# Patient Record
Sex: Female | Born: 1975 | Race: Black or African American | Hispanic: No | Marital: Married | Smoking: Current every day smoker
Health system: Southern US, Community
[De-identification: ages and names within clinical notes are randomized; demographics above are authoritative.]

## PROBLEM LIST (undated history)

## (undated) DIAGNOSIS — F329 Major depressive disorder, single episode, unspecified: Secondary | ICD-10-CM

## (undated) DIAGNOSIS — F1910 Other psychoactive substance abuse, uncomplicated: Secondary | ICD-10-CM

## (undated) DIAGNOSIS — M549 Dorsalgia, unspecified: Secondary | ICD-10-CM

## (undated) DIAGNOSIS — F32A Depression, unspecified: Secondary | ICD-10-CM

## (undated) HISTORY — DX: Major depressive disorder, single episode, unspecified: F32.9

## (undated) HISTORY — DX: Depression, unspecified: F32.A

## (undated) HISTORY — DX: Other psychoactive substance abuse, uncomplicated: F19.10

## (undated) HISTORY — PX: DEBRIDEMENT AND CLOSURE WOUND: SHX5614

## (undated) HISTORY — PX: ABDOMINAL HYSTERECTOMY: SHX81

---

## 2001-01-14 ENCOUNTER — Emergency Department (HOSPITAL_COMMUNITY): Admission: EM | Admit: 2001-01-14 | Discharge: 2001-01-14 | Payer: Self-pay | Admitting: Internal Medicine

## 2004-10-15 ENCOUNTER — Emergency Department (HOSPITAL_COMMUNITY): Admission: EM | Admit: 2004-10-15 | Discharge: 2004-10-15 | Payer: Self-pay | Admitting: *Deleted

## 2005-02-27 ENCOUNTER — Emergency Department (HOSPITAL_COMMUNITY): Admission: EM | Admit: 2005-02-27 | Discharge: 2005-02-27 | Payer: Self-pay | Admitting: Emergency Medicine

## 2005-04-26 ENCOUNTER — Emergency Department (HOSPITAL_COMMUNITY): Admission: EM | Admit: 2005-04-26 | Discharge: 2005-04-26 | Payer: Self-pay | Admitting: Emergency Medicine

## 2005-06-12 ENCOUNTER — Emergency Department (HOSPITAL_COMMUNITY): Admission: EM | Admit: 2005-06-12 | Discharge: 2005-06-12 | Payer: Self-pay | Admitting: Emergency Medicine

## 2005-08-06 ENCOUNTER — Emergency Department (HOSPITAL_COMMUNITY): Admission: EM | Admit: 2005-08-06 | Discharge: 2005-08-06 | Payer: Self-pay | Admitting: Emergency Medicine

## 2005-11-30 ENCOUNTER — Emergency Department (HOSPITAL_COMMUNITY): Admission: EM | Admit: 2005-11-30 | Discharge: 2005-11-30 | Payer: Self-pay | Admitting: Emergency Medicine

## 2006-02-12 ENCOUNTER — Emergency Department (HOSPITAL_COMMUNITY): Admission: EM | Admit: 2006-02-12 | Discharge: 2006-02-12 | Payer: Self-pay | Admitting: Emergency Medicine

## 2006-07-30 ENCOUNTER — Emergency Department (HOSPITAL_COMMUNITY): Admission: EM | Admit: 2006-07-30 | Discharge: 2006-07-30 | Payer: Self-pay | Admitting: Emergency Medicine

## 2006-12-13 ENCOUNTER — Emergency Department (HOSPITAL_COMMUNITY): Admission: EM | Admit: 2006-12-13 | Discharge: 2006-12-13 | Payer: Self-pay | Admitting: Emergency Medicine

## 2007-01-10 ENCOUNTER — Emergency Department (HOSPITAL_COMMUNITY): Admission: EM | Admit: 2007-01-10 | Discharge: 2007-01-10 | Payer: Self-pay | Admitting: Emergency Medicine

## 2007-02-27 ENCOUNTER — Emergency Department (HOSPITAL_COMMUNITY): Admission: EM | Admit: 2007-02-27 | Discharge: 2007-02-27 | Payer: Self-pay | Admitting: Emergency Medicine

## 2007-03-05 ENCOUNTER — Emergency Department (HOSPITAL_COMMUNITY): Admission: EM | Admit: 2007-03-05 | Discharge: 2007-03-05 | Payer: Self-pay | Admitting: Emergency Medicine

## 2007-04-18 ENCOUNTER — Emergency Department (HOSPITAL_COMMUNITY): Admission: EM | Admit: 2007-04-18 | Discharge: 2007-04-18 | Payer: Self-pay | Admitting: Emergency Medicine

## 2007-06-20 ENCOUNTER — Emergency Department (HOSPITAL_COMMUNITY): Admission: EM | Admit: 2007-06-20 | Discharge: 2007-06-20 | Payer: Self-pay | Admitting: Emergency Medicine

## 2007-11-25 ENCOUNTER — Emergency Department (HOSPITAL_COMMUNITY): Admission: EM | Admit: 2007-11-25 | Discharge: 2007-11-25 | Payer: Self-pay | Admitting: Emergency Medicine

## 2007-12-25 ENCOUNTER — Emergency Department (HOSPITAL_COMMUNITY): Admission: EM | Admit: 2007-12-25 | Discharge: 2007-12-25 | Payer: Self-pay | Admitting: Emergency Medicine

## 2008-02-17 ENCOUNTER — Emergency Department (HOSPITAL_COMMUNITY): Admission: EM | Admit: 2008-02-17 | Discharge: 2008-02-17 | Payer: Self-pay | Admitting: Emergency Medicine

## 2008-03-19 ENCOUNTER — Emergency Department (HOSPITAL_COMMUNITY): Admission: EM | Admit: 2008-03-19 | Discharge: 2008-03-19 | Payer: Self-pay | Admitting: Emergency Medicine

## 2008-04-21 ENCOUNTER — Emergency Department (HOSPITAL_COMMUNITY): Admission: EM | Admit: 2008-04-21 | Discharge: 2008-04-21 | Payer: Self-pay | Admitting: Emergency Medicine

## 2008-05-30 ENCOUNTER — Emergency Department (HOSPITAL_COMMUNITY): Admission: EM | Admit: 2008-05-30 | Discharge: 2008-05-30 | Payer: Self-pay | Admitting: Emergency Medicine

## 2008-06-24 ENCOUNTER — Emergency Department (HOSPITAL_COMMUNITY): Admission: EM | Admit: 2008-06-24 | Discharge: 2008-06-24 | Payer: Self-pay | Admitting: Emergency Medicine

## 2008-12-24 ENCOUNTER — Emergency Department (HOSPITAL_COMMUNITY): Admission: EM | Admit: 2008-12-24 | Discharge: 2008-12-24 | Payer: Self-pay | Admitting: Emergency Medicine

## 2009-06-13 IMAGING — CR DG LUMBAR SPINE COMPLETE 4+V
5 series · 5 of 5 positions shown · non-contrast
Comparison: none

CLINICAL DATA: Fall, low back pain

LUMBAR SPINE - 4  VIEW:

[view not recorded (1 of 5)]
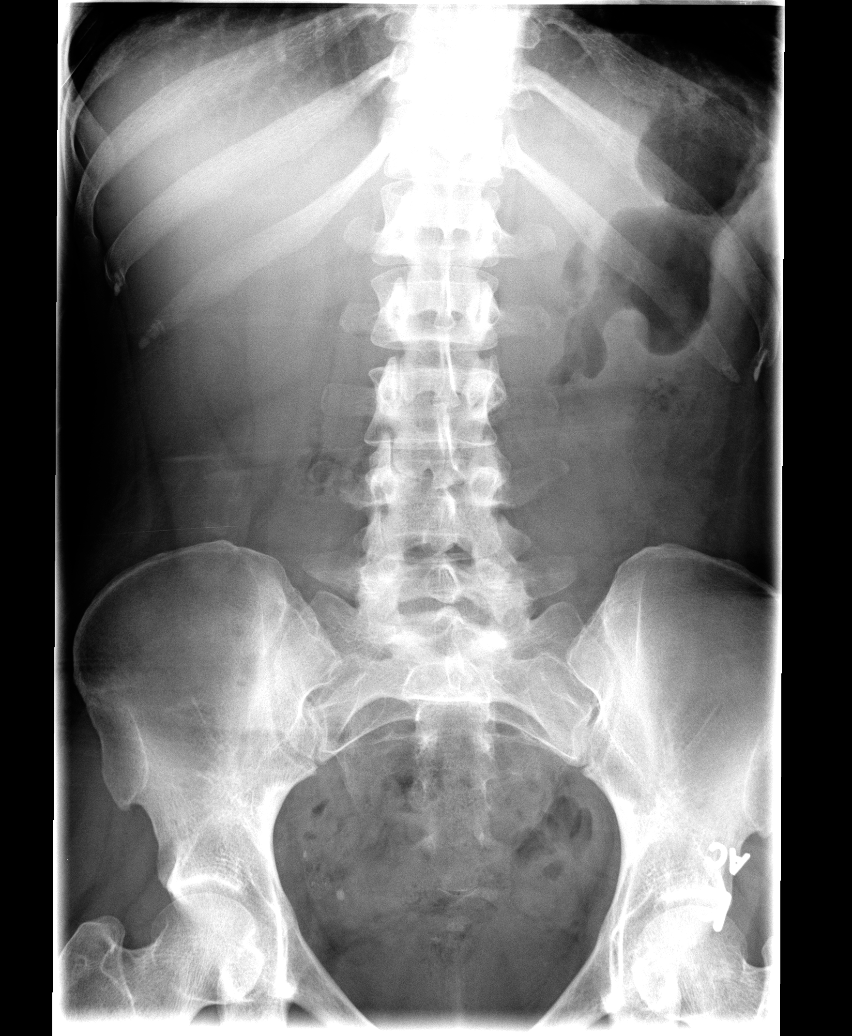

[view not recorded (2 of 5)]
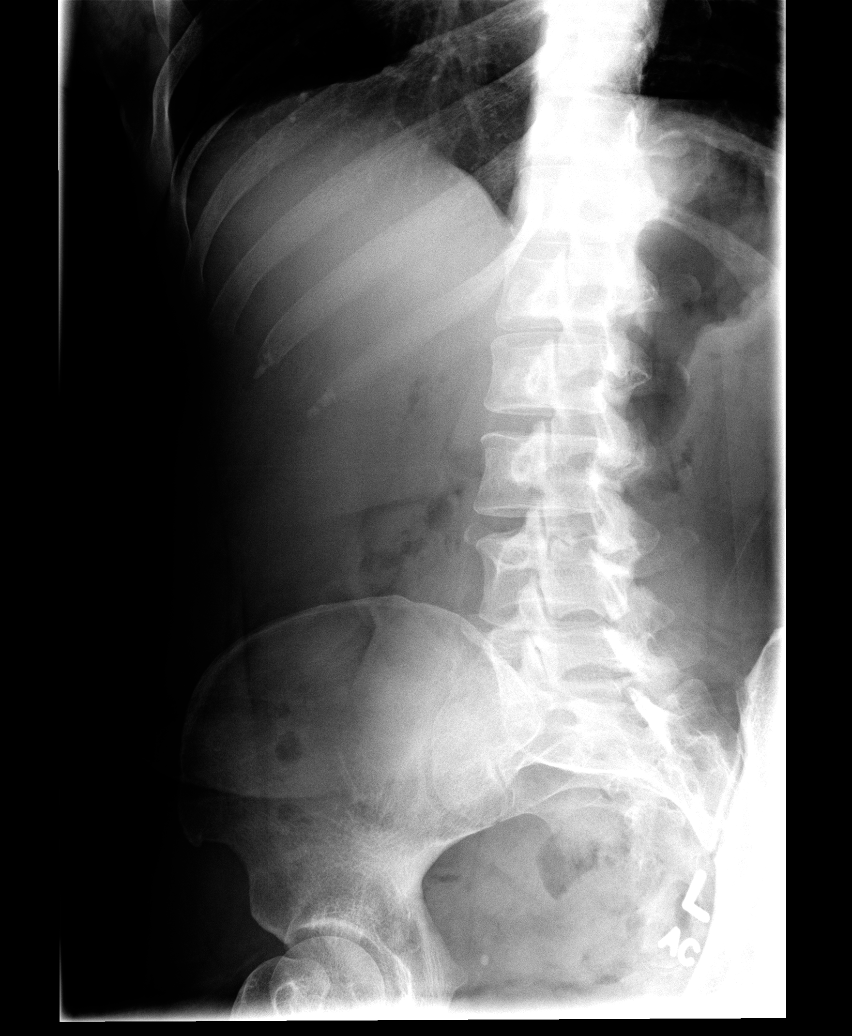

[view not recorded (3 of 5)]
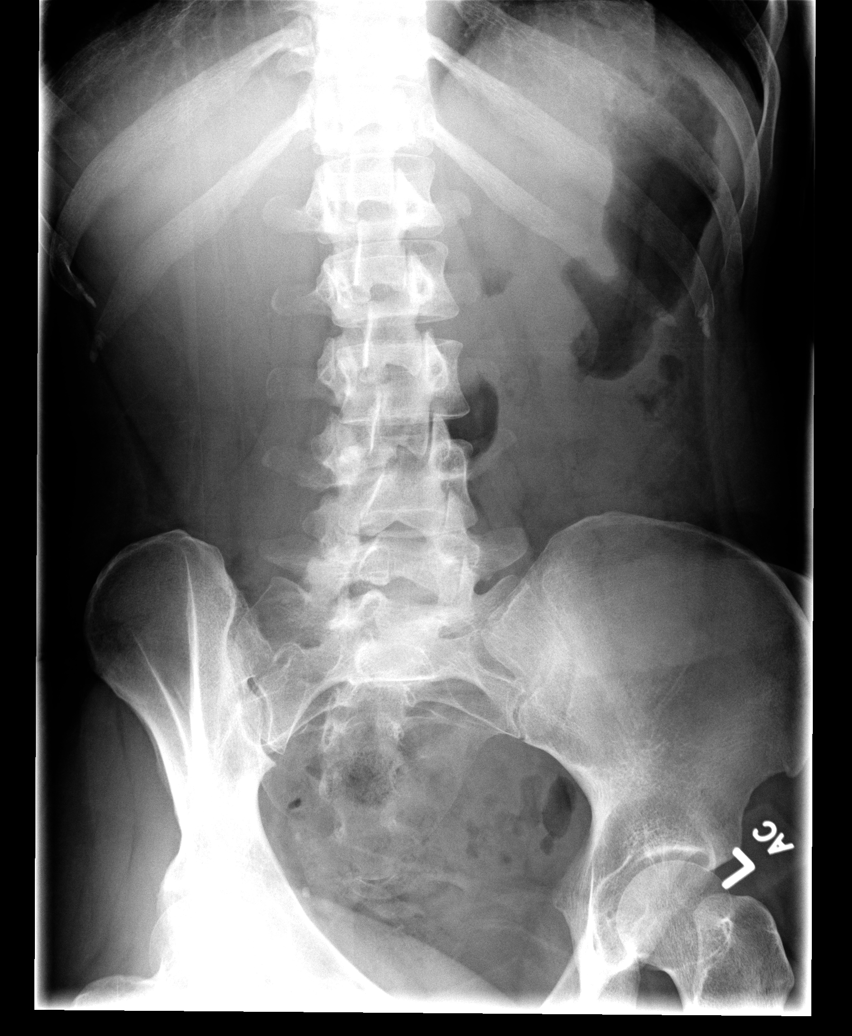

[view not recorded (4 of 5)]
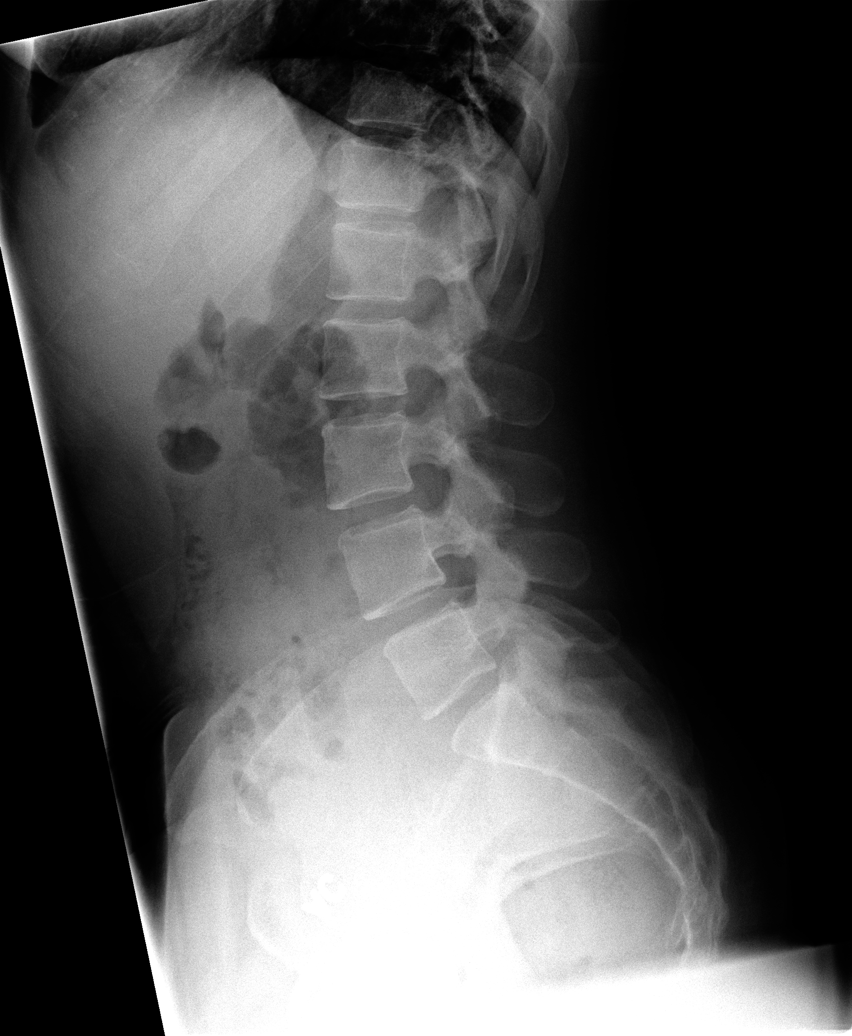

[view not recorded (5 of 5)]
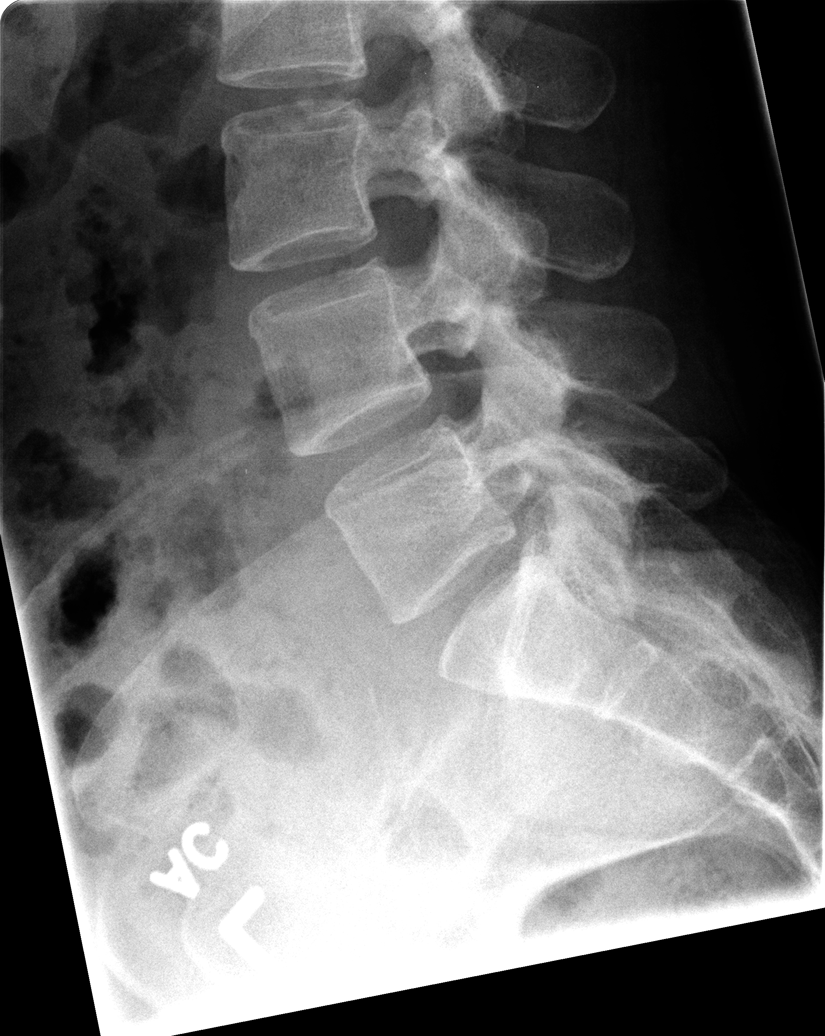

[5 of 5 positions shown; findings below may reference images not displayed]

FINDINGS: There is no evidence of lumbar spine fracture.  Alignment is normal. 
Intervertebral disc spaces are maintained, and no other significant bone
abnormalities are identified.
IMPRESSION: Negative lumbar spine radiographs.

## 2009-11-17 ENCOUNTER — Emergency Department (HOSPITAL_COMMUNITY): Admission: EM | Admit: 2009-11-17 | Discharge: 2009-11-17 | Payer: Self-pay | Admitting: Diagnostic Radiology

## 2010-09-25 ENCOUNTER — Emergency Department (HOSPITAL_COMMUNITY)
Admission: EM | Admit: 2010-09-25 | Discharge: 2010-09-25 | Payer: Self-pay | Source: Home / Self Care | Admitting: Emergency Medicine

## 2011-02-16 ENCOUNTER — Inpatient Hospital Stay (INDEPENDENT_AMBULATORY_CARE_PROVIDER_SITE_OTHER)
Admission: RE | Admit: 2011-02-16 | Discharge: 2011-02-16 | Disposition: A | Discharge status: Home / Self Care | Payer: Self-pay | Source: Ambulatory Visit | Attending: Family Medicine | Admitting: Family Medicine

## 2011-02-16 DIAGNOSIS — K089 Disorder of teeth and supporting structures, unspecified: Secondary | ICD-10-CM

## 2011-05-10 ENCOUNTER — Emergency Department (HOSPITAL_COMMUNITY)
Admission: EM | Admit: 2011-05-10 | Discharge: 2011-05-10 | Disposition: A | Discharge status: Home / Self Care | Payer: Self-pay | Attending: Emergency Medicine | Admitting: Emergency Medicine

## 2011-05-10 ENCOUNTER — Encounter: Payer: Self-pay | Admitting: *Deleted

## 2011-05-10 DIAGNOSIS — K047 Periapical abscess without sinus: Secondary | ICD-10-CM

## 2011-05-10 DIAGNOSIS — K044 Acute apical periodontitis of pulpal origin: Secondary | ICD-10-CM | POA: Insufficient documentation

## 2011-05-10 DIAGNOSIS — S335XXA Sprain of ligaments of lumbar spine, initial encounter: Secondary | ICD-10-CM | POA: Insufficient documentation

## 2011-05-10 DIAGNOSIS — X500XXA Overexertion from strenuous movement or load, initial encounter: Secondary | ICD-10-CM | POA: Insufficient documentation

## 2011-05-10 DIAGNOSIS — F17200 Nicotine dependence, unspecified, uncomplicated: Secondary | ICD-10-CM | POA: Insufficient documentation

## 2011-05-10 DIAGNOSIS — Y92009 Unspecified place in unspecified non-institutional (private) residence as the place of occurrence of the external cause: Secondary | ICD-10-CM | POA: Insufficient documentation

## 2011-05-10 DIAGNOSIS — M549 Dorsalgia, unspecified: Secondary | ICD-10-CM | POA: Insufficient documentation

## 2011-05-10 HISTORY — DX: Dorsalgia, unspecified: M54.9

## 2011-05-10 MED ORDER — AMOXICILLIN 500 MG PO CAPS: 500.0000 mg | ORAL_CAPSULE | Freq: Three times a day (TID) | ORAL | Status: AC

## 2011-05-10 MED ORDER — PREDNISONE 20 MG PO TABS: 60.0000 mg | ORAL_TABLET | Freq: Every day | ORAL | Status: AC

## 2011-05-10 MED ORDER — HYDROCODONE-ACETAMINOPHEN 5-325 MG PO TABS: 1.0000 | ORAL_TABLET | ORAL | Status: AC | PRN

## 2011-05-10 NOTE — ED Notes (Signed)
Low back pain that started Friday night. Hx of back pain.  Denies new injury.  Denies numbness/tingling.  Top left tooth pain that started yesterday.

## 2011-05-11 NOTE — ED Provider Notes (Signed)
History     CSN: [id number] Arrival date & time: 05/10/2011  9:35 AM  Chief Complaint  Patient presents with  . Back Pain  . Dental Pain   Patient is a 35 y.o. female presenting with back pain and tooth pain. The history is provided by the patient.  Back Pain  This is a recurrent problem. The current episode started more than 2 days ago. The problem occurs constantly. The problem has not changed since onset.Associated with: Patiient moved furniture at her home 3 nights ago,  and has had increase in her chronic low back pain. The pain is present in the lumbar spine. The quality of the pain is described as aching. The pain does not radiate. The pain is at a severity of 8/10. The pain is moderate. The symptoms are aggravated by certain positions, bending and twisting. The pain is the same all the time. Pertinent negatives include no chest pain, no fever, no numbness, no headaches, no abdominal pain, no abdominal swelling, no bowel incontinence, no perianal numbness, no dysuria, no leg pain, no paresthesias, no paresis, no tingling and no weakness. She has tried analgesics for the symptoms. The treatment provided no relief.  Dental PainThe primary symptoms include dental injury. Primary symptoms do not include headaches, fever, shortness of breath or sore throat. Primary symptoms comment: Upper left 1st molar tooth with chronic decay,  a new piece fell out several days ago,  causing increased pain and sensitivity to cold,   The symptoms began 2 days ago. The symptoms are unchanged. The symptoms occur constantly.  Additional symptoms include: dental sensitivity to temperature, gum swelling and gum tenderness. Additional symptoms do not include: jaw pain, facial swelling and trouble swallowing.    Past Medical History  Diagnosis Date  . Back pain     History reviewed. No pertinent past surgical history.  No family history on file.  History  Substance Use Topics  . Smoking status: Current  Everyday Smoker    Types: Cigarettes  . Smokeless tobacco: Not on file  . Alcohol Use: No    OB History    Grav Para Term Preterm Abortions TAB SAB Ect Mult Living                  Review of Systems  Constitutional: Negative for fever.  HENT: Positive for dental problem. Negative for congestion, sore throat, facial swelling, trouble swallowing and neck pain.   Eyes: Negative.   Respiratory: Negative for chest tightness and shortness of breath.   Cardiovascular: Negative for chest pain.  Gastrointestinal: Negative for nausea, abdominal pain and bowel incontinence.  Genitourinary: Negative.  Negative for dysuria.  Musculoskeletal: Positive for back pain. Negative for myalgias, joint swelling and gait problem.  Skin: Negative.  Negative for rash and wound.  Neurological: Negative for dizziness, tingling, weakness, light-headedness, numbness, headaches and paresthesias.  Hematological: Negative.   Psychiatric/Behavioral: Negative.     Physical Exam  BP 131/79  Pulse 113  Temp(Src) 98.7 F (37.1 C) (Oral)  Resp 16  Ht 5\' 7"  (1.702 m)  Wt 160 lb (72.576 kg)  BMI 25.06 kg/m2  SpO2 100%  LMP 05/05/2011  Physical Exam  Constitutional: She is oriented to person, place, and time. She appears well-developed and well-nourished.  HENT:  Head: Normocephalic and atraumatic.  Mouth/Throat:    Eyes: Conjunctivae are normal.  Neck: Normal range of motion. Neck supple.  Cardiovascular: Regular rhythm and intact distal pulses.        Pedal pulses  normal.  Pulmonary/Chest: Effort normal. She has no wheezes.  Abdominal: Soft. Bowel sounds are normal. She exhibits no distension and no mass.  Musculoskeletal: Normal range of motion. She exhibits no edema.       Lumbar back: She exhibits tenderness. She exhibits no swelling, no edema and no spasm.  Neurological: She is alert and oriented to person, place, and time. She has normal strength. She displays no atrophy and no tremor. No cranial  nerve deficit or sensory deficit. Gait normal. GCS eye subscore is 4. GCS verbal subscore is 5. GCS motor subscore is 6.  Reflex Scores:      Patellar reflexes are 2+ on the right side and 2+ on the left side.      Achilles reflexes are 2+ on the right side and 2+ on the left side.      No strength deficit noted in hip and knee flexor and extensor muscle groups.  Ankle flexion and extension intact.  Skin: Skin is warm and dry.  Psychiatric: She has a normal mood and affect.    ED Course  Procedures  MDM Chronic low back pain with no neuro deficits on exam or by pt report.  Dental infection with severe molar dental decay/fracture      Candis Musa, PA 05/11/11 1035

## 2011-05-12 NOTE — ED Provider Notes (Signed)
Medical screening examination/treatment/procedure(s) were performed by non-physician practitioner and as supervising physician I was immediately available for consultation/collaboration.   Forbes Cellar, MD 05/12/11 [phone]

## 2011-10-09 ENCOUNTER — Emergency Department (HOSPITAL_COMMUNITY)
Admission: EM | Admit: 2011-10-09 | Discharge: 2011-10-09 | Disposition: A | Discharge status: Home / Self Care | Payer: Self-pay | Attending: Emergency Medicine | Admitting: Emergency Medicine

## 2011-10-09 ENCOUNTER — Encounter (HOSPITAL_COMMUNITY): Payer: Self-pay | Admitting: *Deleted

## 2011-10-09 DIAGNOSIS — K029 Dental caries, unspecified: Secondary | ICD-10-CM | POA: Insufficient documentation

## 2011-10-09 DIAGNOSIS — R599 Enlarged lymph nodes, unspecified: Secondary | ICD-10-CM | POA: Insufficient documentation

## 2011-10-09 DIAGNOSIS — R000 Tachycardia, unspecified: Secondary | ICD-10-CM | POA: Insufficient documentation

## 2011-10-09 DIAGNOSIS — F17200 Nicotine dependence, unspecified, uncomplicated: Secondary | ICD-10-CM | POA: Insufficient documentation

## 2011-10-09 MED ORDER — PENICILLIN V POTASSIUM 250 MG PO TABS
500.0000 mg | ORAL_TABLET | Freq: Once | ORAL | Status: AC
  Administered 2011-10-09: 500 mg via ORAL
  Filled 2011-10-09: qty 2

## 2011-10-09 MED ORDER — PROMETHAZINE HCL 12.5 MG PO TABS
12.5000 mg | ORAL_TABLET | Freq: Once | ORAL | Status: AC
  Administered 2011-10-09: 12.5 mg via ORAL
  Filled 2011-10-09: qty 1

## 2011-10-09 MED ORDER — IBUPROFEN 800 MG PO TABS
800.0000 mg | ORAL_TABLET | Freq: Once | ORAL | Status: AC
  Administered 2011-10-09: 800 mg via ORAL
  Filled 2011-10-09: qty 1

## 2011-10-09 MED ORDER — AMOXICILLIN 500 MG PO CAPS: ORAL_CAPSULE | ORAL | Status: DC

## 2011-10-09 MED ORDER — HYDROCODONE-ACETAMINOPHEN 5-325 MG PO TABS
2.0000 | ORAL_TABLET | Freq: Once | ORAL | Status: AC
  Administered 2011-10-09: 2 via ORAL
  Filled 2011-10-09: qty 2

## 2011-10-09 MED ORDER — HYDROCODONE-ACETAMINOPHEN 5-325 MG PO TABS: 1.0000 | ORAL_TABLET | ORAL | Status: AC | PRN

## 2011-10-09 NOTE — ED Provider Notes (Signed)
Medical screening examination/treatment/procedure(s) were performed by non-physician practitioner and as supervising physician I was immediately available for consultation/collaboration.  Flint Melter, MD 10/09/11 [phone]

## 2011-10-09 NOTE — ED Notes (Signed)
C/o left upper jaw pain from broken tooth x 2 days, no dentist, seen at clinic in [place] and not able to work on it, needs to be surgically worked on

## 2011-10-09 NOTE — ED Notes (Signed)
C/o left upper jaw pain

## 2011-10-09 NOTE — ED Provider Notes (Signed)
History     CSN: [id number]  Arrival date & time 10/09/11  1212   First MD Initiated Contact with Patient 10/09/11 1320      Chief Complaint  Patient presents with  . Dental Pain    (Consider location/radiation/quality/duration/timing/severity/associated sxs/prior treatment) Patient is a 36 y.o. female presenting with tooth pain.  Dental PainPrimary symptoms do not include shortness of breath or cough.  Additional symptoms do not include: nosebleeds.    Past Medical History  Diagnosis Date  . Back pain     History reviewed. No pertinent past surgical history.  History reviewed. No pertinent family history.  History  Substance Use Topics  . Smoking status: Current Everyday Smoker -- 1.0 packs/day    Types: Cigarettes  . Smokeless tobacco: Not on file  . Alcohol Use: No    OB History    Grav Para Term Preterm Abortions TAB SAB Ect Mult Living                  Review of Systems  Constitutional: Negative for activity change.       All ROS Neg except as noted in HPI  HENT: Positive for dental problem. Negative for nosebleeds and neck pain.   Eyes: Negative for photophobia and discharge.  Respiratory: Negative for cough, shortness of breath and wheezing.   Cardiovascular: Negative for chest pain and palpitations.  Gastrointestinal: Negative for abdominal pain and blood in stool.  Genitourinary: Negative for dysuria, frequency and hematuria.  Musculoskeletal: Negative for back pain and arthralgias.  Skin: Negative.   Neurological: Negative for dizziness, seizures and speech difficulty.  Psychiatric/Behavioral: Negative for hallucinations and confusion.    Allergies  Review of patient's allergies indicates no known allergies.  Home Medications   Current Outpatient Rx  Name Route Sig Dispense Refill  . ACETAMINOPHEN 500 MG PO TABS Oral Take 500 mg by mouth every 6 (six) hours as needed. For pain    . GOODY HEADACHE PO Oral Take 1 packet by mouth daily as  needed. For pain    . IBUPROFEN 200 MG PO TABS Oral Take 400 mg by mouth every 6 (six) hours as needed. For pain    . AMOXICILLIN 500 MG PO CAPS  2 tabs by mouth twice a day with food 28 capsule 0  . HYDROCODONE-ACETAMINOPHEN 5-325 MG PO TABS Oral Take 1 tablet by mouth every 4 (four) hours as needed for pain. 20 tablet 0    BP 132/79  Pulse 114  Temp(Src) 98.6 F (37 C) (Oral)  Resp 18  Ht 5\' 7"  (1.702 m)  Wt 165 lb (74.844 kg)  BMI 25.84 kg/m2  SpO2 100%  LMP 09/27/2011  Physical Exam  Nursing note and vitals reviewed. Constitutional: She is oriented to person, place, and time. She appears well-developed and well-nourished.  Non-toxic appearance.  HENT:  Head: Normocephalic.  Right Ear: Tympanic membrane and external ear normal.  Left Ear: Tympanic membrane and external ear normal.       The left upper second molar is decayed to the gumline and beyond. There is swelling around the decayed tooth. No visible abscess is seen. Airway is patent.  Eyes: EOM and lids are normal. Pupils are equal, round, and reactive to light.  Neck: Normal range of motion. Neck supple. Carotid bruit is not present.  Cardiovascular: Regular rhythm, normal heart sounds, intact distal pulses and normal pulses.  Tachycardia present.   Pulmonary/Chest: Breath sounds normal. No respiratory distress.  Abdominal: Soft. Bowel sounds are  normal. There is no tenderness. There is no guarding.  Musculoskeletal: Normal range of motion.  Lymphadenopathy:       Head (right side): No submandibular adenopathy present.       Head (left side): No submandibular adenopathy present.    She has cervical adenopathy.  Neurological: She is alert and oriented to person, place, and time. She has normal strength. No cranial nerve deficit or sensory deficit.  Skin: Skin is warm and dry.  Psychiatric: Her speech is normal. Her mood appears anxious.       Patient is tearful because of pain, and also because of inability at this time  to see an oral surgeon due to financial reasons.    ED Course  Procedures (including critical care time)  Labs Reviewed - No data to display No results found.   1. Dental caries       MDM  I have reviewed nursing notes, vital signs, and all appropriate lab and imaging results for this patient. Patient states that she went to a clinic in [place] and was told that she would have to see an Transport planner. When she spoke with an oral surgeon she was told she would need $300 for the first visit. Patient state she does not have that, but is tired of hurting because the pain is interfering with her activities of daily living. Patient advised to call affordable dentures. As well as the clinic at the [facility]. Prescription for amoxicillin and Norco given to the patient.      Kathie Dike, [place] 10/09/11 1635

## 2012-10-16 ENCOUNTER — Encounter (HOSPITAL_COMMUNITY): Payer: Self-pay | Admitting: *Deleted

## 2012-10-16 ENCOUNTER — Emergency Department (HOSPITAL_COMMUNITY)
Admission: EM | Admit: 2012-10-16 | Discharge: 2012-10-16 | Disposition: A | Discharge status: Home / Self Care | Payer: Self-pay | Attending: Emergency Medicine | Admitting: Emergency Medicine

## 2012-10-16 DIAGNOSIS — F17200 Nicotine dependence, unspecified, uncomplicated: Secondary | ICD-10-CM | POA: Insufficient documentation

## 2012-10-16 DIAGNOSIS — K0889 Other specified disorders of teeth and supporting structures: Secondary | ICD-10-CM

## 2012-10-16 DIAGNOSIS — K029 Dental caries, unspecified: Secondary | ICD-10-CM | POA: Insufficient documentation

## 2012-10-16 DIAGNOSIS — Z79899 Other long term (current) drug therapy: Secondary | ICD-10-CM | POA: Insufficient documentation

## 2012-10-16 DIAGNOSIS — Z8739 Personal history of other diseases of the musculoskeletal system and connective tissue: Secondary | ICD-10-CM | POA: Insufficient documentation

## 2012-10-16 DIAGNOSIS — K089 Disorder of teeth and supporting structures, unspecified: Secondary | ICD-10-CM | POA: Insufficient documentation

## 2012-10-16 MED ORDER — NAPROXEN 250 MG PO TABS: 250.0000 mg | ORAL_TABLET | Freq: Two times a day (BID) | ORAL | Status: DC

## 2012-10-16 MED ORDER — PENICILLIN V POTASSIUM 250 MG PO TABS: 250.0000 mg | ORAL_TABLET | Freq: Four times a day (QID) | ORAL | Status: DC

## 2012-10-16 MED ORDER — HYDROCODONE-ACETAMINOPHEN 5-325 MG PO TABS: ORAL_TABLET | ORAL | Status: DC

## 2012-10-16 NOTE — ED Provider Notes (Signed)
History     CSN: [id number]  Arrival date & time 10/16/12  2036   First MD Initiated Contact with Patient 10/16/12 2038      Chief Complaint  Patient presents with  . Dental Pain     HPI Pt was seen at 2035.  Per pt, c/o gradual onset and persistence of constant right upper teeth "pain" for the past 2 days.  Denies fevers, no intra-oral edema, no rash, no facial swelling, no dysphagia, no neck pain.   The condition is aggravated by nothing. The condition is relieved by nothing. The symptoms have been associated with no other complaints. The patient has no significant history of serious medical conditions.    Past Medical History  Diagnosis Date  . Back pain     History reviewed. No pertinent past surgical history.   History  Substance Use Topics  . Smoking status: Current Every Day Smoker -- 1.0 packs/day    Types: Cigarettes  . Smokeless tobacco: Not on file  . Alcohol Use: No    Review of Systems ROS: Statement: All systems negative except as marked or noted in the HPI; Constitutional: Negative for fever and chills. ; ; Eyes: Negative for eye pain and discharge. ; ; ENMT: Positive for dental caries, dental hygiene poor and toothache. Negative for ear pain, bleeding gums, dental injury, facial deformity, facial swelling, hoarseness, nasal congestion, sinus pressure, sore throat, throat swelling and tongue swollen. ; ; Cardiovascular: Negative for chest pain, palpitations, diaphoresis, dyspnea and peripheral edema. ; ; Respiratory: Negative for cough, wheezing and stridor. ; ; Gastrointestinal: Negative for nausea, vomiting, diarrhea and abdominal pain. ; ; Genitourinary: Negative for dysuria, flank pain and hematuria. ; ; Musculoskeletal: Negative for back pain and neck pain. ; ; Skin: Negative for rash and skin lesion. ; ; Neuro: Negative for headache, lightheadedness and neck stiffness. ;    Allergies  Review of patient's allergies indicates no known allergies.  Home  Medications   Current Outpatient Rx  Name  Route  Sig  Dispense  Refill  . ACETAMINOPHEN 500 MG PO TABS   Oral   Take 500 mg by mouth every 6 (six) hours as needed. For pain         . AMOXICILLIN 500 MG PO CAPS      2 tabs by mouth twice a day with food   28 capsule   0   . GOODY HEADACHE PO   Oral   Take 1 packet by mouth daily as needed. For pain         . HYDROCODONE-ACETAMINOPHEN 5-325 MG PO TABS      1 or 2 tabs PO q6 hours prn pain   20 tablet   0   . IBUPROFEN 200 MG PO TABS   Oral   Take 400 mg by mouth every 6 (six) hours as needed. For pain         . NAPROXEN 250 MG PO TABS   Oral   Take 1 tablet (250 mg total) by mouth 2 (two) times daily with a meal.   14 tablet   0   . PENICILLIN V POTASSIUM 250 MG PO TABS   Oral   Take 1 tablet (250 mg total) by mouth 4 (four) times daily.   20 tablet   0     BP 116/75  Pulse 113  Temp 97.9 F (36.6 C) (Oral)  Resp 20  Ht 5\' 7"  (1.702 m)  Wt 150 lb (68.04 kg)  BMI 23.49 kg/m2  SpO2 99%  Physical Exam 2040: Physical examination: Vital signs and O2 SAT: Reviewed; Constitutional: Well developed, Well nourished, Well hydrated, In no acute distress; Head and Face: Normocephalic, Atraumatic; Eyes: EOMI, PERRL, No scleral icterus; ENMT: Mouth and pharynx normal, Poor dentition, Widespread dental decay, Left TM normal, Right TM normal, Mucous membranes moist, +upper right canine and 1st premolar with dental decay.  No gingival erythema, edema, fluctuance, or drainage.  No intra-oral edema. No hoarse voice, no drooling, no stridor.  ; Neck: Supple, Full range of motion, No lymphadenopathy; Cardiovascular: Regular rate and rhythm, No murmur, rub, or gallop; Respiratory: Breath sounds clear & equal bilaterally, No rales, rhonchi, wheezes, or rub, Normal respiratory effort/excursion; Chest: Nontender, Movement normal; Extremities: Pulses normal, No tenderness, No edema; Neuro: AA&Ox3, Major CN grossly intact.  No gross  focal motor or sensory deficits in extremities.; Skin: Color normal, No rash, No petechiae, Warm, Dry   ED Course  Procedures     MDM  MDM Reviewed: nursing note, vitals and previous chart     2045:  Pt encouraged to f/u with dentist or oral surgeon for her dental needs for good continuity of care and definitive treatment.  Verb understanding.        Laray Anger, DO 10/17/12 2205

## 2012-10-16 NOTE — ED Notes (Addendum)
Pt reporting toothache since Sat.  Right upper side.  EDP in triage to see pt.

## 2015-07-15 ENCOUNTER — Encounter: Payer: Self-pay | Admitting: Family Medicine

## 2015-07-15 DIAGNOSIS — M549 Dorsalgia, unspecified: Secondary | ICD-10-CM | POA: Insufficient documentation

## 2015-07-15 DIAGNOSIS — F32A Depression, unspecified: Secondary | ICD-10-CM | POA: Insufficient documentation

## 2015-07-15 DIAGNOSIS — F329 Major depressive disorder, single episode, unspecified: Secondary | ICD-10-CM | POA: Insufficient documentation

## 2015-07-25 ENCOUNTER — Ambulatory Visit (INDEPENDENT_AMBULATORY_CARE_PROVIDER_SITE_OTHER): Payer: BLUE CROSS/BLUE SHIELD | Admitting: Family Medicine

## 2015-07-25 ENCOUNTER — Encounter: Payer: Self-pay | Admitting: Family Medicine

## 2015-07-25 VITALS — BP 118/70 | HR 78 | Temp 98.3°F | Resp 14 | Ht 67.0 in | Wt 138.0 lb

## 2015-07-25 DIAGNOSIS — Z0000 Encounter for general adult medical examination without abnormal findings: Secondary | ICD-10-CM | POA: Diagnosis not present

## 2015-07-25 DIAGNOSIS — Z1239 Encounter for other screening for malignant neoplasm of breast: Secondary | ICD-10-CM | POA: Diagnosis not present

## 2015-07-25 DIAGNOSIS — Z113 Encounter for screening for infections with a predominantly sexual mode of transmission: Secondary | ICD-10-CM | POA: Diagnosis not present

## 2015-07-25 DIAGNOSIS — F329 Major depressive disorder, single episode, unspecified: Secondary | ICD-10-CM | POA: Diagnosis not present

## 2015-07-25 DIAGNOSIS — F1910 Other psychoactive substance abuse, uncomplicated: Secondary | ICD-10-CM | POA: Diagnosis not present

## 2015-07-25 DIAGNOSIS — Z1321 Encounter for screening for nutritional disorder: Secondary | ICD-10-CM

## 2015-07-25 DIAGNOSIS — R5383 Other fatigue: Secondary | ICD-10-CM

## 2015-07-25 DIAGNOSIS — Z124 Encounter for screening for malignant neoplasm of cervix: Secondary | ICD-10-CM | POA: Diagnosis not present

## 2015-07-25 DIAGNOSIS — Z23 Encounter for immunization: Secondary | ICD-10-CM

## 2015-07-25 DIAGNOSIS — F17200 Nicotine dependence, unspecified, uncomplicated: Secondary | ICD-10-CM

## 2015-07-25 DIAGNOSIS — F32A Depression, unspecified: Secondary | ICD-10-CM

## 2015-07-25 LAB — CBC WITH DIFFERENTIAL/PLATELET
BASOS PCT: 1 % (ref 0–1)
Basophils Absolute: 0 10*3/uL (ref 0.0–0.1)
EOS ABS: 0.1 10*3/uL (ref 0.0–0.7)
Eosinophils Relative: 3 % (ref 0–5)
HCT: 39.7 % (ref 36.0–46.0)
HEMOGLOBIN: 12.8 g/dL (ref 12.0–15.0)
Lymphocytes Relative: 53 % — ABNORMAL HIGH (ref 12–46)
Lymphs Abs: 2.5 10*3/uL (ref 0.7–4.0)
MCH: 30.3 pg (ref 26.0–34.0)
MCHC: 32.2 g/dL (ref 30.0–36.0)
MCV: 93.9 fL (ref 78.0–100.0)
MONO ABS: 0.3 10*3/uL (ref 0.1–1.0)
MPV: 11.2 fL (ref 8.6–12.4)
Monocytes Relative: 7 % (ref 3–12)
NEUTROS ABS: 1.7 10*3/uL (ref 1.7–7.7)
NEUTROS PCT: 36 % — AB (ref 43–77)
PLATELETS: 175 10*3/uL (ref 150–400)
RBC: 4.23 MIL/uL (ref 3.87–5.11)
RDW: 13.4 % (ref 11.5–15.5)
WBC: 4.7 10*3/uL (ref 4.0–10.5)

## 2015-07-25 LAB — LIPID PANEL
Cholesterol: 176 mg/dL (ref 125–200)
HDL: 62 mg/dL (ref >=46)
LDL CALC: 106 mg/dL (ref <130)
TRIGLYCERIDES: 39 mg/dL (ref <150)
Total CHOL/HDL Ratio: 2.8 Ratio (ref <=5.0)
VLDL: 8 mg/dL (ref <30)

## 2015-07-25 LAB — COMPREHENSIVE METABOLIC PANEL
ALBUMIN: 4.1 g/dL (ref 3.6–5.1)
ALT: 34 U/L — ABNORMAL HIGH (ref 6–29)
AST: 33 U/L — AB (ref 10–30)
Alkaline Phosphatase: 57 U/L (ref 33–115)
BILIRUBIN TOTAL: 0.4 mg/dL (ref 0.2–1.2)
BUN: 14 mg/dL (ref 7–25)
CO2: 30 mmol/L (ref 20–31)
CREATININE: 0.89 mg/dL (ref 0.50–1.10)
Calcium: 8.7 mg/dL (ref 8.6–10.2)
Chloride: 102 mmol/L (ref 98–110)
GLUCOSE: 83 mg/dL (ref 70–99)
Potassium: 3.9 mmol/L (ref 3.5–5.3)
SODIUM: 139 mmol/L (ref 135–146)
Total Protein: 7.3 g/dL (ref 6.1–8.1)

## 2015-07-25 LAB — WET PREP FOR TRICH, YEAST, CLUE
TRICH WET PREP: NONE SEEN
YEAST WET PREP: NONE SEEN

## 2015-07-25 LAB — TSH: TSH: 1.822 u[IU]/mL (ref 0.350–4.500)

## 2015-07-25 LAB — VITAMIN B12: Vitamin B-12: 614 pg/mL (ref 211–911)

## 2015-07-25 MED ORDER — BUPROPION HCL ER (SR) 150 MG PO TB12: 150.0000 mg | ORAL_TABLET | Freq: Two times a day (BID) | ORAL | Status: DC

## 2015-07-25 NOTE — Progress Notes (Signed)
Patient ID: Crystal Pugh, female   DOB: [birth year], 39 y.o.   MRN: [id number] 07/25/2015     Subjective:    Patient ID: Crystal Pugh, female    DOB: [birth year], 39 y.o.   MRN: [id number]  Patient presents for New Patient CPE  Here to establish care. She's not had a primary care doctor in greater than 10 years. She is currently being followed by the [facility] she has history of depression as well as substance abuse. After hysterectomy and severe endometriosis she became hooked on pain killers. She's been off of painkillers for the past 4 years however she does use cocaine every now and then and also smokes marijuana and tobacco. She wants to get off of all of these substances. She is currently in sepsis abuse counseling at her [facility] clinic. She states that they have not dealt with her depression she was on medications in the past. She does think that she needs something to help with her mood she feels sad like she wants to cry out time. She was working but is no longer. She is married and her husband also has history of substance abuse and is in [facility] clinic. She has a daughter in college she suffers with depression and she has a 40 year old at home. She denies any use of any IV drug abuse.    she did ask to have her vitamin levels checked. States that she's been taking vitamin D and B-12.    Review Of Systems:  GEN- + fatigue, fever, weight loss,weakness, recent illness HEENT- denies eye drainage, change in vision, nasal discharge, CVS- denies chest pain, palpitations RESP- denies SOB, cough, wheeze ABD- denies N/V, change in stools, abd pain GU- denies dysuria, hematuria, dribbling, incontinence MSK- denies joint pain, muscle aches, injury Neuro- denies headache, dizziness, syncope, seizure activity       Objective:    BP 118/70 mmHg  Pulse 78  Temp(Src) 98.3 F (36.8 C) (Oral)  Resp 14  Ht 5\' 7"  (1.702 m)  Wt 138 lb (62.596 kg)  BMI 21.61 kg/m2  LMP  09/27/2011 GEN- NAD, alert and oriented x3 HEENT- PERRL, EOMI, non injected sclera, pink conjunctiva, MMM, oropharynx clear Neck- Supple, no thyromegaly Breast- normal symmetry, no nipple inversion,no nipple drainage, no nodules or lumps felt Nodes- no axillary nodes CVS- RRR, no murmur RESP-CTAB ABD-NABS,soft,NT,ND GU- normal external genitalia, vaginal mucosa atrophy, s/p hysterectomy ovaries not palpated,urethra normal position,no bladder prolapse  Psych-Tearful at times, not anxious appearing,no SI, normal speech, good eye contact, very polite  EXT- No edema Pulses- Radial, DP- 2+        Assessment & Plan:      Problem List Items Addressed This Visit    Tobacco use disorder   Depression    Trial of wellbutrin twice a day, will also help with smoking cessation      Relevant Medications   buPROPion (WELLBUTRIN SR) 150 MG 12 hr tablet    Other Visit Diagnoses    Routine general medical examination at a health care facility    -  Primary    CPE done, no PAP, mammo to be done, Complete STD screen, HIV testing, will check vitamin levels Im sure she had a degree of malnutrion due to subtance abuse    Relevant Orders    CBC with Differential/Platelet    Comprehensive metabolic panel    Lipid panel    TSH    Pap smear for cervical cancer screening  Encounter for vitamin deficiency screening        Relevant Orders    Vitamin D, 25-hydroxy    Vitamin B12    Breast cancer screening        Relevant Orders    MS DIGITAL SCREENING TOMO BILATERAL    Screen for STD (sexually transmitted disease)        Relevant Orders    WET PREP FOR TRICH, YEAST, CLUE (Completed)    GC/Chlamydia Probe Amp    HIV antibody    RPR    Hepatitis C antibody, reflex    Other fatigue        Relevant Orders    Vitamin B12    Need for prophylactic vaccination with combined diphtheria-tetanus-pertussis (DTP) vaccine        Relevant Orders    Tdap vaccine greater than or equal to 7yo IM  (Completed)    Need for prophylactic vaccination and inoculation against influenza        Relevant Orders    Flu Vaccine QUAD 36+ mos PF IM (Fluarix & Fluzone Quad PF) (Completed)       Note: This dictation was prepared with Dragon dictation along with smaller phrase technology. Any transcriptional errors that result from this process are unintentional.

## 2015-07-25 NOTE — Patient Instructions (Addendum)
Start wellbutrin- as prescribed for your mood- - for 3 days, take 1 tablet in morning, then 1 tablet twice a day  We will call with lab results Flu and tetanus booster given Schedule your mammogram F/U 4 weeks for recheck

## 2015-07-25 NOTE — Assessment & Plan Note (Signed)
Trial of wellbutrin twice a day, will also help with smoking cessation

## 2015-07-26 LAB — HIV ANTIBODY (ROUTINE TESTING W REFLEX): HIV 1&2 Ab, 4th Generation: NONREACTIVE

## 2015-07-26 LAB — GC/CHLAMYDIA PROBE AMP
CT Probe RNA: NEGATIVE
GC PROBE AMP APTIMA: NEGATIVE

## 2015-07-26 LAB — RPR

## 2015-07-26 LAB — HEPATITIS C ANTIBODY: HCV AB: NEGATIVE

## 2015-07-26 LAB — VITAMIN D 25 HYDROXY (VIT D DEFICIENCY, FRACTURES): Vit D, 25-Hydroxy: 43 ng/mL (ref 30–100)

## 2015-07-31 ENCOUNTER — Encounter: Payer: Self-pay | Admitting: *Deleted

## 2015-08-12 ENCOUNTER — Encounter: Payer: Self-pay | Admitting: *Deleted

## 2015-08-13 ENCOUNTER — Encounter: Payer: Self-pay | Admitting: Family Medicine

## 2015-08-22 ENCOUNTER — Ambulatory Visit: Payer: BLUE CROSS/BLUE SHIELD | Admitting: Family Medicine

## 2016-10-08 ENCOUNTER — Encounter: Payer: BLUE CROSS/BLUE SHIELD | Admitting: Family Medicine

## 2017-05-07 ENCOUNTER — Emergency Department (HOSPITAL_COMMUNITY)
Admission: EM | Admit: 2017-05-07 | Discharge: 2017-05-07 | Disposition: A | Discharge status: Home Health | Payer: Self-pay | Attending: Emergency Medicine | Admitting: Emergency Medicine

## 2017-05-07 ENCOUNTER — Encounter (HOSPITAL_COMMUNITY): Payer: Self-pay | Admitting: Emergency Medicine

## 2017-05-07 DIAGNOSIS — T7840XA Allergy, unspecified, initial encounter: Secondary | ICD-10-CM | POA: Insufficient documentation

## 2017-05-07 DIAGNOSIS — H05223 Edema of bilateral orbit: Secondary | ICD-10-CM | POA: Insufficient documentation

## 2017-05-07 DIAGNOSIS — Z79899 Other long term (current) drug therapy: Secondary | ICD-10-CM | POA: Insufficient documentation

## 2017-05-07 DIAGNOSIS — F419 Anxiety disorder, unspecified: Secondary | ICD-10-CM | POA: Insufficient documentation

## 2017-05-07 DIAGNOSIS — F17210 Nicotine dependence, cigarettes, uncomplicated: Secondary | ICD-10-CM | POA: Insufficient documentation

## 2017-05-07 MED ORDER — KETOROLAC TROMETHAMINE 0.5 % OP SOLN
1.0000 [drp] | Freq: Once | OPHTHALMIC | Status: AC
  Administered 2017-05-07: 1 [drp] via OPHTHALMIC
  Filled 2017-05-07: qty 5

## 2017-05-07 MED ORDER — LORATADINE 10 MG PO TABS: 10.0000 mg | ORAL_TABLET | Freq: Every day | ORAL | 0 refills | Status: DC

## 2017-05-07 MED ORDER — DIPHENHYDRAMINE HCL 25 MG PO CAPS
25.0000 mg | ORAL_CAPSULE | Freq: Once | ORAL | Status: AC
  Administered 2017-05-07: 25 mg via ORAL
  Filled 2017-05-07: qty 1

## 2017-05-07 NOTE — Discharge Instructions (Signed)
I suspect you may be reacting to something new in your environment,  your new softener dryer sheet may be the culprit.  There is no findings today to suggest you have any infestation, flea bites, bed bugs, scabies or any other problems that can cause itching.  Apply the drop given 3 times daily to your eyes - this will help with itching and inflammation. You may continue to take benadryl for itching, also an allergy medicine such as claritin may be helpful.  Avoid rubbing and touching your eyes which can drive the itching.  Cool compresses or ice packs can be applied to your eyes if they itch.

## 2017-05-07 NOTE — ED Provider Notes (Signed)
[facility] DEPT Provider Note   CSN: [id number] Arrival date & time: 05/07/17  4098     History   Chief Complaint Chief Complaint  Patient presents with  . Insect Bite    HPI Crystal Pugh is a 41 y.o. female presenting with all over itching which has been present for the past week.  She states she was a week late giving her dog his nexguard medicine as is concerned for possible fleas, but denies any rash or visible insect bites.  She has had allover itching including her scalp and eyes and has noticed discharge from the corners of her eyes.  She denies fevers, chills, rash. She endorses being anxious since the itching began magnified by her family who is stating "it's all in her head".   She has started using a new dryer fabric softener sheet prior to starting to itch.  She has taken benadryl with temporary improvement in itching.    The history is provided by the patient.    Past Medical History:  Diagnosis Date  . Back pain   . Depression   . Substance abuse     Patient Active Problem List   Diagnosis Date Noted  . Tobacco use disorder 07/25/2015  . Substance abuse 07/25/2015  . Depression   . Back pain     Past Surgical History:  Procedure Laterality Date  . ABDOMINAL HYSTERECTOMY    . DEBRIDEMENT AND CLOSURE WOUND      OB History    No data available       Home Medications    Prior to Admission medications   Medication Sig Start Date End Date Taking? Authorizing Provider  acetaminophen (TYLENOL) 500 MG tablet Take 500 mg by mouth every 6 (six) hours as needed. For pain    [provider]  buprenorphine (SUBUTEX) 8 MG SUBL SL tablet Place 8 mg under the tongue as directed. 1 tablet under tongue 2 times daily and 1/2 tablet every evening 05/05/17   [provider]  buprenorphine-naloxone (SUBOXONE) 8-2 MG SUBL SL tablet Place 2 tablets under the tongue daily.    [provider]  buPROPion (WELLBUTRIN SR) 150 MG 12 hr tablet  Take 1 tablet (150 mg total) by mouth 2 (two) times daily. 07/25/15   Salley Scarlet, MD  LINZESS 145 MCG CAPS capsule Take 145 mcg by mouth daily as needed. 04/17/15   [provider]  loratadine (CLARITIN) 10 MG tablet Take 1 tablet (10 mg total) by mouth daily. 05/07/17   Burgess Amor, PA-C  naproxen (NAPROSYN) 250 MG tablet Take 1 tablet (250 mg total) by mouth 2 (two) times daily with a meal. 10/16/12   Samuel Jester, DO    Family History Family History  Problem Relation Age of Onset  . Alcohol abuse Mother   . Stroke Mother   . Alcohol abuse Father   . Depression Father   . Heart disease Father   . Cancer Maternal Grandmother   . Alcohol abuse Sister   . Stroke Paternal Aunt     Social History Social History  Substance Use Topics  . Smoking status: Current Every Day Smoker    Packs/day: 1.50    Types: Cigarettes  . Smokeless tobacco: Never Used  . Alcohol use No     Allergies   Patient has no known allergies.   Review of Systems Review of Systems  Constitutional: Negative for chills and fever.  Respiratory: Negative for shortness of breath and wheezing.  Skin: Negative for rash.       Itching per hpi  Neurological: Negative for numbness.  Psychiatric/Behavioral: The patient is nervous/anxious.      Physical Exam Updated Vital Signs BP 128/87 (BP Location: Right Arm)   Pulse (!) 107   Temp 98.3 F (36.8 C) (Oral)   Resp 16   Ht 5\' 7"  (1.702 m)   Wt 68 kg (150 lb)   LMP 09/27/2011   SpO2 99%   BMI 23.49 kg/m   Physical Exam  Constitutional: She appears well-developed and well-nourished. No distress.  HENT:  Head: Normocephalic.  Eyes: Pupils are equal, round, and reactive to light. EOM are normal. Right eye exhibits no chemosis and no discharge. Left eye exhibits no chemosis and no discharge. Right conjunctiva is injected. Left conjunctiva is injected.  Boggy periorbital edema, no erythema.   Neck: Neck supple.  Cardiovascular: Normal  rate.   Pulmonary/Chest: Effort normal. She has no wheezes.  Musculoskeletal: Normal range of motion. She exhibits no edema.  Skin: No rash noted.  No rash, no lesions or evidence of pediculosis including scalp.  Dry extremities.     ED Treatments / Results  Labs (all labs ordered are listed, but only abnormal results are displayed) Labs Reviewed - No data to display  EKG  EKG Interpretation None       Radiology No results found.  Procedures Procedures (including critical care time)  Medications Ordered in ED Medications  diphenhydrAMINE (BENADRYL) capsule 25 mg (not administered)  ketorolac (ACULAR) 0.5 % ophthalmic solution 1 drop (not administered)     Initial Impression / Assessment and Plan / ED Course  I have reviewed the triage vital signs and the nursing notes.  Pertinent labs & imaging results that were available during my care of the patient were reviewed by me and considered in my medical decision making (see chart for details).     Probable contact allergic dermatitis, suspect her dryer sheets which are new.  She was advised continued benadryl and/or claritin although she denies sedation with the benadryl.  Ketorolac drops given for eye itchiness/ inflammation. No evidence for infection.  She was advised cool compresses, recheck by pcp if sx not improving.   Final Clinical Impressions(s) / ED Diagnoses   Final diagnoses:  Allergic reaction, initial encounter    New Prescriptions New Prescriptions   LORATADINE (CLARITIN) 10 MG TABLET    Take 1 tablet (10 mg total) by mouth daily.     Burgess Amor, PA-C 05/07/17 1059    Donnetta Hutching, MD 05/08/17 0730

## 2017-05-07 NOTE — ED Triage Notes (Signed)
Pt states she has " something all over her"  ? Bugs.  C/o itching all over.  States she thinks she got something off her dog.

## 2017-05-09 ENCOUNTER — Encounter (HOSPITAL_COMMUNITY): Payer: Self-pay | Admitting: Emergency Medicine

## 2017-05-09 ENCOUNTER — Emergency Department (HOSPITAL_COMMUNITY)
Admission: EM | Admit: 2017-05-09 | Discharge: 2017-05-09 | Disposition: A | Discharge status: Home / Self Care | Payer: Self-pay | Attending: Emergency Medicine | Admitting: Emergency Medicine

## 2017-05-09 DIAGNOSIS — A6000 Herpesviral infection of urogenital system, unspecified: Secondary | ICD-10-CM | POA: Insufficient documentation

## 2017-05-09 DIAGNOSIS — F17210 Nicotine dependence, cigarettes, uncomplicated: Secondary | ICD-10-CM | POA: Insufficient documentation

## 2017-05-09 DIAGNOSIS — Z79899 Other long term (current) drug therapy: Secondary | ICD-10-CM | POA: Insufficient documentation

## 2017-05-09 DIAGNOSIS — L299 Pruritus, unspecified: Secondary | ICD-10-CM

## 2017-05-09 LAB — COMPREHENSIVE METABOLIC PANEL
ALT: 15 U/L (ref 14–54)
ANION GAP: 6 (ref 5–15)
AST: 23 U/L (ref 15–41)
Albumin: 4.4 g/dL (ref 3.5–5.0)
Alkaline Phosphatase: 65 U/L (ref 38–126)
BUN: 12 mg/dL (ref 6–20)
CHLORIDE: 100 mmol/L — AB (ref 101–111)
CO2: 29 mmol/L (ref 22–32)
CREATININE: 1.03 mg/dL — AB (ref 0.44–1.00)
Calcium: 9.1 mg/dL (ref 8.9–10.3)
GLUCOSE: 94 mg/dL (ref 65–99)
Potassium: 3.4 mmol/L — ABNORMAL LOW (ref 3.5–5.1)
Sodium: 135 mmol/L (ref 135–145)
Total Bilirubin: 0.3 mg/dL (ref 0.3–1.2)
Total Protein: 8.6 g/dL — ABNORMAL HIGH (ref 6.5–8.1)

## 2017-05-09 LAB — CBC
HEMATOCRIT: 42.1 % (ref 36.0–46.0)
HEMOGLOBIN: 14 g/dL (ref 12.0–15.0)
MCH: 31.6 pg (ref 26.0–34.0)
MCHC: 33.3 g/dL (ref 30.0–36.0)
MCV: 95 fL (ref 78.0–100.0)
Platelets: 167 10*3/uL (ref 150–400)
RBC: 4.43 MIL/uL (ref 3.87–5.11)
RDW: 12.8 % (ref 11.5–15.5)
WBC: 7.3 10*3/uL (ref 4.0–10.5)

## 2017-05-09 MED ORDER — VALACYCLOVIR HCL 500 MG PO TABS: 500.0000 mg | ORAL_TABLET | Freq: Two times a day (BID) | ORAL | 0 refills | Status: AC

## 2017-05-09 MED ORDER — HYDROXYZINE HCL 25 MG PO TABS: 25.0000 mg | ORAL_TABLET | Freq: Four times a day (QID) | ORAL | 0 refills | Status: DC

## 2017-05-09 NOTE — ED Provider Notes (Signed)
[facility] Provider Note   CSN: [id number] Arrival date & time: 05/09/17  1407     History   Chief Complaint Chief Complaint  Patient presents with  . Pruritis    HPI Crystal Pugh is a 41 y.o. female.  HPI Patient presents to the emergency room for evaluation of persistent itching that started about a week and a half ago. Patient states she is having itching throughout her entire body. She notices spots on her arms and face chest  And legs.  Patient does have pets but does treat them for fleas. Patient denies any trouble with fevers or chills. She does not recall any new detergents or lotions. She has been taking Benadryl with some temporary relief. Patient was seen in the emergency room 2 days ago. She was instructed to take Claritin and follow up with a primary care doctor. Patient states her symptoms persisted so she came back to the emergency room.patient also mentions having some sores in the genitalarea that itch. Past Medical History:  Diagnosis Date  . Back pain   . Depression   . Substance abuse     Patient Active Problem List   Diagnosis Date Noted  . Tobacco use disorder 07/25/2015  . Substance abuse 07/25/2015  . Depression   . Back pain     Past Surgical History:  Procedure Laterality Date  . ABDOMINAL HYSTERECTOMY    . DEBRIDEMENT AND CLOSURE WOUND      OB History    No data available       Home Medications    Prior to Admission medications   Medication Sig Start Date End Date Taking? Authorizing Provider  acetaminophen (TYLENOL) 500 MG tablet Take 500 mg by mouth every 6 (six) hours as needed. For pain    [provider]  buprenorphine (SUBUTEX) 8 MG SUBL SL tablet Place 8 mg under the tongue as directed. 1 tablet under tongue 2 times daily and 1/2 tablet every evening 05/05/17   [provider]  buprenorphine-naloxone (SUBOXONE) 8-2 MG SUBL SL tablet Place 2 tablets under the tongue daily.    [provider]    buPROPion (WELLBUTRIN SR) 150 MG 12 hr tablet Take 1 tablet (150 mg total) by mouth 2 (two) times daily. 07/25/15   Salley Scarlet, MD  LINZESS 145 MCG CAPS capsule Take 145 mcg by mouth daily as needed. 04/17/15   [provider]  loratadine (CLARITIN) 10 MG tablet Take 1 tablet (10 mg total) by mouth daily. 05/07/17   Burgess Amor, PA-C  naproxen (NAPROSYN) 250 MG tablet Take 1 tablet (250 mg total) by mouth 2 (two) times daily with a meal. 10/16/12   Samuel Jester, DO    Family History Family History  Problem Relation Age of Onset  . Alcohol abuse Mother   . Stroke Mother   . Alcohol abuse Father   . Depression Father   . Heart disease Father   . Cancer Maternal Grandmother   . Alcohol abuse Sister   . Stroke Paternal Aunt     Social History Social History  Substance Use Topics  . Smoking status: Current Every Day Smoker    Packs/day: 1.50    Types: Cigarettes  . Smokeless tobacco: Never Used  . Alcohol use No     Allergies   Patient has no known allergies.   Review of Systems Review of Systems  Constitutional: Negative for fever.  HENT: Negative for trouble swallowing.   Respiratory: Negative for cough  and shortness of breath.   Gastrointestinal: Negative for nausea.  Genitourinary: Positive for genital sores. Negative for dysuria.  All other systems reviewed and are negative.    Physical Exam Updated Vital Signs BP 132/78 (BP Location: Right Arm)   Pulse (!) 118   Temp 98.6 F (37 C) (Oral)   Resp 20   Ht 1.702 m (5\' 7" )   Wt 68 kg (150 lb)   LMP 09/27/2011   SpO2 100%   BMI 23.49 kg/m   Physical Exam  Constitutional: She appears well-developed and well-nourished. No distress.  HENT:  Head: Normocephalic and atraumatic.  Right Ear: External ear normal.  Left Ear: External ear normal.  Eyes: Conjunctivae are normal. Right eye exhibits no discharge. Left eye exhibits no discharge. No scleral icterus.  Neck: Neck supple. No tracheal  deviation present.  Cardiovascular: Normal rate.   Pulmonary/Chest: Effort normal. No stridor. No respiratory distress.  Abdominal: She exhibits no distension.  Genitourinary:  Genitourinary Comments: A few ulcerations noted in the perineum around the labia, patient states she does have a history of genital herpes  Musculoskeletal: She exhibits no edema.  Neurological: She is alert. Cranial nerve deficit: no gross deficits.  Skin: Skin is warm and dry. No rash noted.  I see a few areas of excoriations but no specific rash on her skin  Psychiatric: She has a normal mood and affect.  Nursing note and vitals reviewed.    ED Treatments / Results  Labs (all labs ordered are listed, but only abnormal results are displayed) Labs Reviewed  CBC  COMPREHENSIVE METABOLIC PANEL  RPR  HIV ANTIBODY (ROUTINE TESTING)    Radiology No results found.  Procedures Procedures (including critical care time)  Medications Ordered in ED Medications - No data to display   Initial Impression / Assessment and Plan / ED Course  I have reviewed the triage vital signs and the nursing notes.  Pertinent labs & imaging results that were available during my care of the patient were reviewed by me and considered in my medical decision making (see chart for details).   patient is having systemic pruritus. I do not see any specific rash to account for her symptoms. I will check some basic laboratory tests to assess for any systemic reason for her to have pruritus.  Had an on and HIV, RPR and a  Comprehensive metabolic panel.  Patient does have some genital lesions consistent with a herpes flare. She has a history of that  Final Clinical Impressions(s) / ED Diagnoses   Final diagnoses:  Genital herpes simplex, unspecified site  Pruritus    New Prescriptions New Prescriptions   No medications on file     Linwood Dibbles, MD 05/09/17 [phone]

## 2017-05-09 NOTE — ED Notes (Signed)
Pt came in due to itching all over body and complaining of itching and pain to vagina. No rash detected on skin anywhere.

## 2017-05-09 NOTE — Discharge Instructions (Signed)
Follow-up with a primary care doctor. You can try applying hydrocortisone cream to areas on your  chest and legs.  Don't apply to the face

## 2017-05-09 NOTE — ED Triage Notes (Addendum)
Patient complaining of itching "from head to toe" since x 2 weeks. States she was here 2 days ago and told she was having an allergic reaction. No rash noted at triage. During triage patient stated "I'm hurting in my vagina also and I don't know why and I can't get anyone to believe me."

## 2017-05-10 LAB — HIV ANTIBODY (ROUTINE TESTING W REFLEX): HIV SCREEN 4TH GENERATION: NONREACTIVE

## 2017-05-10 LAB — SYPHILIS: RPR W/REFLEX TO RPR TITER AND TREPONEMAL ANTIBODIES, TRADITIONAL SCREENING AND DIAGNOSIS ALGORITHM: RPR Ser Ql: NONREACTIVE

## 2017-09-01 ENCOUNTER — Encounter (HOSPITAL_COMMUNITY): Payer: Self-pay | Admitting: *Deleted

## 2017-09-01 ENCOUNTER — Emergency Department (HOSPITAL_COMMUNITY)
Admission: EM | Admit: 2017-09-01 | Discharge: 2017-09-01 | Disposition: A | Discharge status: Home / Self Care | Payer: Self-pay | Attending: Emergency Medicine | Admitting: Emergency Medicine

## 2017-09-01 ENCOUNTER — Other Ambulatory Visit: Payer: Self-pay

## 2017-09-01 DIAGNOSIS — F329 Major depressive disorder, single episode, unspecified: Secondary | ICD-10-CM | POA: Insufficient documentation

## 2017-09-01 DIAGNOSIS — F17210 Nicotine dependence, cigarettes, uncomplicated: Secondary | ICD-10-CM | POA: Insufficient documentation

## 2017-09-01 DIAGNOSIS — F419 Anxiety disorder, unspecified: Secondary | ICD-10-CM | POA: Insufficient documentation

## 2017-09-01 DIAGNOSIS — L299 Pruritus, unspecified: Secondary | ICD-10-CM | POA: Insufficient documentation

## 2017-09-01 DIAGNOSIS — Z79899 Other long term (current) drug therapy: Secondary | ICD-10-CM | POA: Insufficient documentation

## 2017-09-01 MED ORDER — ALPRAZOLAM 0.25 MG PO TABS: 0.2500 mg | ORAL_TABLET | Freq: Three times a day (TID) | ORAL | 0 refills | Status: DC

## 2017-09-01 NOTE — Discharge Instructions (Signed)
As discussed, it is possible your itching is from anxiety as I do not see any rash or evidence of bugs (pediculosis or head lice) and your symptoms seem worsened during times of stress.  You may take the medicine prescribed for the next 7 days to see if this improves your symptoms. This medicine can cause drowsiness, use caution with this medicine.  See the resources listed above for establishing a primary doctor.

## 2017-09-01 NOTE — ED Triage Notes (Signed)
Pt c/o right eyelid swelling and redness and scalp itching x 2-3 days. Pt was seen for the same eye complaint 4 months ago and told it was allergies, but pt reports the symptoms have persisted.

## 2017-09-01 NOTE — ED Provider Notes (Signed)
[facility] EMERGENCY DEPARTMENT Provider Note   CSN: [id number] Arrival date & time: 09/01/17  1817     History   Chief Complaint Chief Complaint  Patient presents with  . Eyelid Problem  . Scalp Itching    HPI Crystal Pugh is a 41 y.o. female with a past medical history including depression and back pain presenting with a 499-month history of persistent itching redness and eyelid swelling of unclear etiology.  She describes persistent itching and has episodes where black spots like substance falls from her scalp which she suspects may be some kind of insect, perhaps obtained from her dog.  She has been seen for the same complaint twice here and once at [facility].  She has been treated for contact dermatitis and also applied OTC Rid to her hair to rule out head lice.  She was in her home with husband and son, neither who endorse similar symptoms.  She is very upset this evening as prior treatments prescribed here and at [facility] have been unsuccessful.  She is unable to see her PCP due to insurance issues for follow-up care.  She does endorse generalized anxiety and also admits that her itching seems to be worse at times when her anxiety is worse.  She does have a history of genital herpes which is currently quiesced sent.  She has found no alleviators, although was prescribed Benadryl and Atarax on prior visits.  She denies fevers or chills, rash, nausea or vomiting.  She has had no recent changes in hair care or facial products.   The history is provided by the patient.    Past Medical History:  Diagnosis Date  . Back pain   . Depression   . Substance abuse [facility](HCC)     Patient Active Problem List   Diagnosis Date Noted  . Tobacco use disorder 07/25/2015  . Substance abuse (HCC) 07/25/2015  . Depression   . Back pain     Past Surgical History:  Procedure Laterality Date  . ABDOMINAL HYSTERECTOMY    . DEBRIDEMENT AND CLOSURE WOUND      OB History    No data available       Home Medications    Prior to Admission medications   Medication Sig Start Date End Date Taking? Authorizing Provider  ALPRAZolam (XANAX) 0.25 MG tablet Take 1 tablet (0.25 mg total) by mouth 3 (three) times daily. 09/01/17   Burgess AmorIdol, Caroline Matters, PA-C  buprenorphine (SUBUTEX) 8 MG SUBL SL tablet Place 8 mg under the tongue as directed. 1 tablet under tongue 2 times daily and 1/2 tablet every evening 05/05/17   [provider]  hydrOXYzine (ATARAX/VISTARIL) 25 MG tablet Take 1 tablet (25 mg total) by mouth every 6 (six) hours. 05/09/17   Linwood DibblesKnapp, Jon, MD  LINZESS 145 MCG CAPS capsule Take 145 mcg by mouth daily as needed. 04/17/15   [provider]    Family History Family History  Problem Relation Age of Onset  . Alcohol abuse Mother   . Stroke Mother   . Alcohol abuse Father   . Depression Father   . Heart disease Father   . Cancer Maternal Grandmother   . Alcohol abuse Sister   . Stroke Paternal Aunt     Social History Social History   Tobacco Use  . Smoking status: Current Every Day Smoker    Packs/day: 1.50    Types: Cigarettes  . Smokeless tobacco: Never Used  Substance Use Topics  . Alcohol use: No  Alcohol/week: 0.0 oz  . Drug use: No     Allergies   Patient has no known allergies.   Review of Systems Review of Systems  Constitutional: Negative for chills and fever.  Respiratory: Negative for shortness of breath and wheezing.   Skin:       Negative except as mentioned in HPI.   Neurological: Negative for numbness.     Physical Exam Updated Vital Signs BP 127/89 (BP Location: Right Arm)   Pulse (!) 119   Temp 98.4 F (36.9 C) (Oral)   Resp 18   Ht 5\' 7"  (1.702 m)   Wt 68 kg (150 lb)   LMP 09/27/2011   SpO2 100%   BMI 23.49 kg/m   Physical Exam  Constitutional: She appears well-developed and well-nourished.  HENT:  Head: Normocephalic and atraumatic.  Eyes: Conjunctivae are normal.  Mild erythema of right upper eyelid.  There  is no rash.  Neck: Normal range of motion.  Cardiovascular: Normal rate, regular rhythm, normal heart sounds and intact distal pulses.  Pulmonary/Chest: Effort normal and breath sounds normal. She has no wheezes.  Abdominal: Soft. Bowel sounds are normal. There is no tenderness.  Musculoskeletal: Normal range of motion.  Neurological: She is alert.  Skin: Skin is warm and dry. No lesion and no rash noted.  Psychiatric: Her speech is normal and behavior is normal. Judgment normal. Her mood appears anxious. Her affect is not inappropriate. Thought content is not paranoid. Cognition and memory are normal. She expresses no suicidal plans and no homicidal plans.  Nursing note and vitals reviewed.    ED Treatments / Results  Labs (all labs ordered are listed, but only abnormal results are displayed) Labs Reviewed - No data to display  EKG  EKG Interpretation None       Radiology No results found.  Procedures Procedures (including critical care time)  Medications Ordered in ED Medications - No data to display   Initial Impression / Assessment and Plan / ED Course  I have reviewed the triage vital signs and the nursing notes.  Pertinent labs & imaging results that were available during my care of the patient were reviewed by me and considered in my medical decision making (see chart for details).     I suspect patient's symptoms may be as a result of increased anxiety and stress reaction.  She has no exam findings that would suggest pediculosis, rash, contact dermatitis.  She was given a short course of Xanax which she states she is willing to try and I gave her reassurance that I see no worrisome physical exam findings.  Referrals given for follow-up care.  Final Clinical Impressions(s) / ED Diagnoses   Final diagnoses:  Anxiety  Itching    ED Discharge Orders        Ordered    ALPRAZolam (XANAX) 0.25 MG tablet  3 times daily     09/01/17 1931       Burgess Amordol, Hutchinson Isenberg,  Cordelia Poche-C 09/01/17 1953    Vanetta MuldersZackowski, Scott, MD 09/03/17 [phone]

## 2018-03-31 DIAGNOSIS — S93401A Sprain of unspecified ligament of right ankle, initial encounter: Secondary | ICD-10-CM | POA: Diagnosis not present

## 2018-04-21 ENCOUNTER — Encounter: Payer: Self-pay | Admitting: Family Medicine

## 2018-04-21 ENCOUNTER — Other Ambulatory Visit: Payer: Self-pay

## 2018-04-21 ENCOUNTER — Ambulatory Visit: Payer: BLUE CROSS/BLUE SHIELD | Admitting: Family Medicine

## 2018-04-21 VITALS — BP 120/68 | HR 98 | Temp 98.2°F | Resp 14 | Ht 67.0 in | Wt 147.0 lb

## 2018-04-21 DIAGNOSIS — L299 Pruritus, unspecified: Secondary | ICD-10-CM | POA: Diagnosis not present

## 2018-04-21 DIAGNOSIS — F1910 Other psychoactive substance abuse, uncomplicated: Secondary | ICD-10-CM | POA: Diagnosis not present

## 2018-04-21 DIAGNOSIS — F411 Generalized anxiety disorder: Secondary | ICD-10-CM | POA: Diagnosis not present

## 2018-04-21 DIAGNOSIS — R238 Other skin changes: Secondary | ICD-10-CM | POA: Diagnosis not present

## 2018-04-21 DIAGNOSIS — R233 Spontaneous ecchymoses: Secondary | ICD-10-CM

## 2018-04-21 MED ORDER — HYDROXYZINE HCL 25 MG PO TABS: 25.0000 mg | ORAL_TABLET | Freq: Four times a day (QID) | ORAL | 1 refills | Status: AC

## 2018-04-21 NOTE — Progress Notes (Signed)
Subjective:    Patient ID: Crystal Pugh, female    DOB: 06-08-1976, 42 y.o.   MRN: [id number]  Patient presents for Skin Issues (itching, frequent brusing and tingling/numbness all over arms/ legs- has been seen in ER and wsa told it's either allergies or stress- states that she feels like things are crawling all over her) and Eye Issues (eyes swollen- eyes are also very itchy)   Dr. Pricilla Larssonardy in [place]- on subxone , zoloft 150mg  once a day   Has had sensation of something crawling into her eyes, hair, skin all over. Has been [facility] 3 times. Told either allergies or stress. Given eye drops which were expensive and dient help Has been checked for scabies, lice. She lives in older house, thought posisble mold in house  Down in her ears itch,  Has tried claritin 10mg  and nasal spray helps occasionally  Was given risperdal by psychiatry- but felt bad after taking 1 dose, but states honestly was afraid of the side effects knowing it was an antipsychotic  Given a new medication starts with an L ? Has not started yet     Has not seen a dermatologist or allergiest   She is very frustrated has been itching for > 8 months and no cause found She has been using cocaine but not excessively as she is trying to keep her job  State she will bruise out of nowhere   Review Of Systems:  GEN- denies fatigue, fever, weight loss,weakness, recent illness HEENT- denies eye drainage, change in vision, nasal discharge, CVS- denies chest pain, palpitations RESP- denies SOB, cough, wheeze ABD- denies N/V, change in stools, abd pain GU- denies dysuria, hematuria, dribbling, incontinence MSK- denies joint pain, muscle aches, injury Neuro- denies headache, dizziness, syncope, seizure activity       Objective:    BP 120/68   Pulse 98   Temp 98.2 F (36.8 C) (Oral)   Resp 14   Ht 5\' 7"  (1.702 m)   Wt 147 lb (66.7 kg)   LMP 09/27/2011   SpO2 100%   BMI 23.02 kg/m  GEN- NAD, alert and oriented  x3 HEENT- PERRL, EOMI, non injected sclera, pink conjunctiva, MMM, oropharynx clear, nares rhinorrhea, mild erythema around her upper lips, irritated appearance Neck- Supple, no thyromegaly, no LAD CVS- RRR, no murmur RESP-CTAB ABD-NABS,soft,NT,ND Skin- excoriations on arms, healed liner lesion on forearm, few bruises on arms and legs  EXT- No edema Pulses- Radial, DP- 2+        Assessment & Plan:      Problem List Items Addressed This Visit      Unprioritized   GAD (generalized anxiety disorder)    Difficult situation, seems her psychiatrisgt has been trying to treat anxiety, nothing acute found in ER, no abnormal liver labs, no uremia found. No infection found Non specific excoriations on skin, but an allergic appearing look around eyes.  I think the scratch itch cycle is part anxiety. But multiple meds have not helped Will add atarax, she does not think she took last year, will send to allergist as well, continue Claritin, meds per psychiatry No change with nasal spray so will d/c      Relevant Medications   sertraline (ZOLOFT) 50 MG tablet   hydrOXYzine (ATARAX/VISTARIL) 25 MG tablet   Substance abuse (HCC)    Ongoing cocaine abuse on suboxone       Other Visit Diagnoses    Pruritus    -  Primary   Relevant  Orders   TSH (Completed)   CBC with Differential/Platelet (Completed)   Comprehensive metabolic panel (Completed)   Easy bruising       Relevant Orders   CBC with Differential/Platelet (Completed)   Protime-INR (Completed)      Note: This dictation was prepared with Dragon dictation along with smaller phrase technology. Any transcriptional errors that result from this process are unintentional.

## 2018-04-21 NOTE — Patient Instructions (Signed)
Referral to allergist  Hydroxyzine for itching  We will call with lab results  You can stop the nose spray  F/U Schedule a physical

## 2018-04-22 LAB — CBC WITH DIFFERENTIAL/PLATELET
BASOS ABS: 58 {cells}/uL (ref 0–200)
Basophils Relative: 1 %
EOS ABS: 151 {cells}/uL (ref 15–500)
Eosinophils Relative: 2.6 %
HEMATOCRIT: 36.3 % (ref 35.0–45.0)
HEMOGLOBIN: 11.8 g/dL (ref 11.7–15.5)
LYMPHS ABS: 2917 {cells}/uL (ref 850–3900)
MCH: 30.6 pg (ref 27.0–33.0)
MCHC: 32.5 g/dL (ref 32.0–36.0)
MCV: 94.3 fL (ref 80.0–100.0)
MPV: 10.9 fL (ref 7.5–12.5)
Monocytes Relative: 7.6 %
NEUTROS ABS: 2233 {cells}/uL (ref 1500–7800)
NEUTROS PCT: 38.5 %
Platelets: 188 10*3/uL (ref 140–400)
RBC: 3.85 10*6/uL (ref 3.80–5.10)
RDW: 12 % (ref 11.0–15.0)
Total Lymphocyte: 50.3 %
WBC: 5.8 10*3/uL (ref 3.8–10.8)
WBCMIX: 441 {cells}/uL (ref 200–950)

## 2018-04-22 LAB — COMPREHENSIVE METABOLIC PANEL
AG RATIO: 1.2 (calc) (ref 1.0–2.5)
ALKALINE PHOSPHATASE (APISO): 65 U/L (ref 33–115)
ALT: 11 U/L (ref 6–29)
AST: 19 U/L (ref 10–30)
Albumin: 4.2 g/dL (ref 3.6–5.1)
BILIRUBIN TOTAL: 0.4 mg/dL (ref 0.2–1.2)
BUN: 11 mg/dL (ref 7–25)
CALCIUM: 9.4 mg/dL (ref 8.6–10.2)
CO2: 28 mmol/L (ref 20–32)
Chloride: 100 mmol/L (ref 98–110)
Creat: 0.85 mg/dL (ref 0.50–1.10)
Globulin: 3.4 g/dL (calc) (ref 1.9–3.7)
Glucose, Bld: 96 mg/dL (ref 65–99)
Potassium: 4 mmol/L (ref 3.5–5.3)
Sodium: 137 mmol/L (ref 135–146)
Total Protein: 7.6 g/dL (ref 6.1–8.1)

## 2018-04-22 LAB — PROTIME-INR
INR: 1
Prothrombin Time: 10.9 s (ref 9.0–11.5)

## 2018-04-22 LAB — TSH: TSH: 1.42 mIU/L

## 2018-04-23 ENCOUNTER — Encounter: Payer: Self-pay | Admitting: Family Medicine

## 2018-04-23 NOTE — Assessment & Plan Note (Signed)
Ongoing cocaine abuse on suboxone

## 2018-04-23 NOTE — Assessment & Plan Note (Addendum)
Difficult situation, seems her psychiatrisgt has been trying to treat anxiety, nothing acute found in ER, no abnormal liver labs, no uremia found. No infection found Non specific excoriations on skin, but an allergic appearing look around eyes.  I think the scratch itch cycle is part anxiety. But multiple meds have not helped Will add atarax, she does not think she took last year, will send to allergist as well, continue Claritin, meds per psychiatry No change with nasal spray so will d/c

## 2018-05-05 ENCOUNTER — Encounter: Payer: BLUE CROSS/BLUE SHIELD | Admitting: Family Medicine

## 2018-05-16 ENCOUNTER — Encounter: Payer: Self-pay | Admitting: Family Medicine

## 2018-06-02 ENCOUNTER — Ambulatory Visit: Payer: BLUE CROSS/BLUE SHIELD | Admitting: Allergy

## 2019-12-05 ENCOUNTER — Ambulatory Visit: Payer: BLUE CROSS/BLUE SHIELD | Admitting: Nurse Practitioner

## 2019-12-05 NOTE — Progress Notes (Signed)
error 

## 2020-03-13 DIAGNOSIS — E756 Lipid storage disorder, unspecified: Secondary | ICD-10-CM | POA: Diagnosis not present

## 2020-03-13 DIAGNOSIS — B182 Chronic viral hepatitis C: Secondary | ICD-10-CM | POA: Diagnosis not present

## 2020-03-13 DIAGNOSIS — E889 Metabolic disorder, unspecified: Secondary | ICD-10-CM | POA: Diagnosis not present

## 2020-03-13 DIAGNOSIS — E349 Endocrine disorder, unspecified: Secondary | ICD-10-CM | POA: Diagnosis not present

## 2020-05-15 ENCOUNTER — Ambulatory Visit: Payer: Self-pay | Attending: Internal Medicine

## 2020-05-15 DIAGNOSIS — Z23 Encounter for immunization: Secondary | ICD-10-CM

## 2020-05-15 NOTE — Progress Notes (Signed)
   Covid-19 Vaccination Clinic  Name:  SHELENA CASTELLUCCIO    MRN: [id number] DOB: Aug 01, 1976  05/15/2020  Ms. Kochel was observed post Covid-19 immunization for 15 minutes without incident. She was provided with Vaccine Information Sheet and instruction to access the V-Safe system.   Ms. Mitten was instructed to call 911 with any severe reactions post vaccine: Marland Kitchen Difficulty breathing  . Swelling of face and throat  . A fast heartbeat  . A bad rash all over body  . Dizziness and weakness   Immunizations Administered    Name Date Dose VIS Date Route   Pfizer COVID-19 Vaccine 05/15/2020  4:59 PM 0.3 mL 11/14/2018 Intramuscular   Manufacturer: [employer], [employer]   Lot: [id number]   NDC: [address]-5

## 2020-06-05 ENCOUNTER — Ambulatory Visit: Payer: Self-pay

## 2022-12-31 DIAGNOSIS — F1120 Opioid dependence, uncomplicated: Secondary | ICD-10-CM | POA: Diagnosis not present

## 2024-01-31 DIAGNOSIS — R531 Weakness: Secondary | ICD-10-CM | POA: Diagnosis not present

## 2024-03-26 DIAGNOSIS — S99922A Unspecified injury of left foot, initial encounter: Secondary | ICD-10-CM | POA: Diagnosis not present

## 2024-03-26 DIAGNOSIS — M7662 Achilles tendinitis, left leg: Secondary | ICD-10-CM | POA: Diagnosis not present

## 2024-03-26 DIAGNOSIS — W2203XA Walked into furniture, initial encounter: Secondary | ICD-10-CM | POA: Diagnosis not present

## 2024-03-26 DIAGNOSIS — F17210 Nicotine dependence, cigarettes, uncomplicated: Secondary | ICD-10-CM | POA: Diagnosis not present
